# Patient Record
Sex: Male | Born: 1955 | Race: White | Hispanic: No
Health system: Southern US, Community
[De-identification: ages and names within clinical notes are randomized; demographics above are authoritative.]

## PROBLEM LIST (undated history)

## (undated) DIAGNOSIS — I10 Essential (primary) hypertension: Secondary | ICD-10-CM

## (undated) DIAGNOSIS — E119 Type 2 diabetes mellitus without complications: Secondary | ICD-10-CM

---

## 1998-11-02 ENCOUNTER — Emergency Department (HOSPITAL_COMMUNITY): Admission: EM | Admit: 1998-11-02 | Discharge: 1998-11-02 | Payer: Self-pay | Admitting: Emergency Medicine

## 1998-11-02 ENCOUNTER — Encounter: Payer: Self-pay | Admitting: Emergency Medicine

## 1998-11-22 ENCOUNTER — Emergency Department (HOSPITAL_COMMUNITY): Admission: EM | Admit: 1998-11-22 | Discharge: 1998-11-22 | Payer: Self-pay | Admitting: *Deleted

## 1999-02-02 ENCOUNTER — Encounter: Payer: Self-pay | Admitting: Emergency Medicine

## 1999-02-02 ENCOUNTER — Emergency Department (HOSPITAL_COMMUNITY): Admission: EM | Admit: 1999-02-02 | Discharge: 1999-02-02 | Payer: Self-pay | Admitting: Emergency Medicine

## 1999-02-17 ENCOUNTER — Emergency Department (HOSPITAL_COMMUNITY): Admission: EM | Admit: 1999-02-17 | Discharge: 1999-02-17 | Payer: Self-pay | Admitting: Emergency Medicine

## 1999-06-18 ENCOUNTER — Emergency Department (HOSPITAL_COMMUNITY): Admission: EM | Admit: 1999-06-18 | Discharge: 1999-06-18 | Payer: Self-pay | Admitting: Emergency Medicine

## 1999-06-18 ENCOUNTER — Encounter: Payer: Self-pay | Admitting: Emergency Medicine

## 2000-04-04 ENCOUNTER — Encounter: Payer: Self-pay | Admitting: Emergency Medicine

## 2000-04-04 ENCOUNTER — Emergency Department (HOSPITAL_COMMUNITY): Admission: EM | Admit: 2000-04-04 | Discharge: 2000-04-04 | Payer: Self-pay | Admitting: Emergency Medicine

## 2000-04-21 ENCOUNTER — Encounter: Admission: RE | Admit: 2000-04-21 | Discharge: 2000-04-21 | Payer: Self-pay | Admitting: Orthopaedic Surgery

## 2000-04-21 ENCOUNTER — Encounter: Payer: Self-pay | Admitting: Orthopaedic Surgery

## 2000-11-10 ENCOUNTER — Encounter: Admission: RE | Admit: 2000-11-10 | Discharge: 2001-02-08 | Payer: Self-pay | Admitting: Anesthesiology

## 2001-12-06 ENCOUNTER — Emergency Department (HOSPITAL_COMMUNITY): Admission: EM | Admit: 2001-12-06 | Discharge: 2001-12-07 | Payer: Self-pay | Admitting: *Deleted

## 2002-01-11 ENCOUNTER — Ambulatory Visit (HOSPITAL_BASED_OUTPATIENT_CLINIC_OR_DEPARTMENT_OTHER): Admission: RE | Admit: 2002-01-11 | Discharge: 2002-01-11 | Payer: Self-pay | Admitting: Orthopaedic Surgery

## 2004-08-15 ENCOUNTER — Emergency Department (HOSPITAL_COMMUNITY): Admission: EM | Admit: 2004-08-15 | Discharge: 2004-08-15 | Payer: Self-pay | Admitting: Emergency Medicine

## 2004-08-16 ENCOUNTER — Ambulatory Visit (HOSPITAL_COMMUNITY): Admission: RE | Admit: 2004-08-16 | Discharge: 2004-08-17 | Payer: Self-pay | Admitting: Orthopaedic Surgery

## 2005-07-16 ENCOUNTER — Ambulatory Visit (HOSPITAL_COMMUNITY): Admission: RE | Admit: 2005-07-16 | Discharge: 2005-07-18 | Payer: Self-pay | Admitting: Orthopaedic Surgery

## 2005-12-10 ENCOUNTER — Ambulatory Visit (HOSPITAL_COMMUNITY): Admission: RE | Admit: 2005-12-10 | Discharge: 2005-12-10 | Payer: Self-pay | Admitting: Orthopaedic Surgery

## 2006-01-14 ENCOUNTER — Ambulatory Visit (HOSPITAL_COMMUNITY): Admission: RE | Admit: 2006-01-14 | Discharge: 2006-01-15 | Payer: Medicare Other | Admitting: Orthopaedic Surgery

## 2006-01-14 ENCOUNTER — Ambulatory Visit: Payer: Self-pay | Admitting: Internal Medicine

## 2006-01-15 ENCOUNTER — Encounter: Payer: Self-pay | Admitting: Emergency Medicine

## 2013-08-20 ENCOUNTER — Other Ambulatory Visit: Payer: Self-pay | Admitting: Physician Assistant

## 2016-03-27 ENCOUNTER — Ambulatory Visit: Payer: Self-pay | Admitting: *Deleted

## 2017-05-01 ENCOUNTER — Other Ambulatory Visit: Payer: Self-pay | Admitting: Internal Medicine

## 2017-05-01 DIAGNOSIS — M5416 Radiculopathy, lumbar region: Secondary | ICD-10-CM

## 2020-07-14 ENCOUNTER — Emergency Department: Admit: 2020-07-14 | Payer: Medicare Other | Admitting: Internal Medicine

## 2020-07-14 ENCOUNTER — Emergency Department: Admission: EM | Disposition: E | Payer: Self-pay | Source: Home / Self Care | Attending: Emergency Medicine

## 2020-07-14 ENCOUNTER — Other Ambulatory Visit: Payer: Self-pay

## 2020-07-14 ENCOUNTER — Emergency Department: Payer: Medicare Other

## 2020-07-14 ENCOUNTER — Encounter: Payer: Self-pay | Admitting: Emergency Medicine

## 2020-07-14 ENCOUNTER — Emergency Department
Admission: EM | Admit: 2020-07-14 | Discharge: 2020-07-17 | Disposition: E | Payer: Medicare Other | Attending: Emergency Medicine | Admitting: Emergency Medicine

## 2020-07-14 DIAGNOSIS — I10 Essential (primary) hypertension: Secondary | ICD-10-CM | POA: Insufficient documentation

## 2020-07-14 DIAGNOSIS — Z20822 Contact with and (suspected) exposure to covid-19: Secondary | ICD-10-CM | POA: Insufficient documentation

## 2020-07-14 DIAGNOSIS — R4182 Altered mental status, unspecified: Secondary | ICD-10-CM | POA: Insufficient documentation

## 2020-07-14 DIAGNOSIS — E1310 Other specified diabetes mellitus with ketoacidosis without coma: Secondary | ICD-10-CM | POA: Insufficient documentation

## 2020-07-14 DIAGNOSIS — I249 Acute ischemic heart disease, unspecified: Secondary | ICD-10-CM

## 2020-07-14 DIAGNOSIS — E1311 Other specified diabetes mellitus with ketoacidosis with coma: Secondary | ICD-10-CM

## 2020-07-14 DIAGNOSIS — I469 Cardiac arrest, cause unspecified: Secondary | ICD-10-CM | POA: Diagnosis present

## 2020-07-14 HISTORY — DX: Type 2 diabetes mellitus without complications: E11.9

## 2020-07-14 HISTORY — DX: Essential (primary) hypertension: I10

## 2020-07-14 LAB — COMPREHENSIVE METABOLIC PANEL
ALT: 62 U/L — ABNORMAL HIGH (ref 0–44)
AST: 154 U/L — ABNORMAL HIGH (ref 15–41)
Albumin: 2.6 g/dL — ABNORMAL LOW (ref 3.5–5.0)
Alkaline Phosphatase: 77 U/L (ref 38–126)
BUN: 54 mg/dL — ABNORMAL HIGH (ref 8–23)
CO2: <7 mmol/L — ABNORMAL LOW (ref 22–32)
Calcium: 7.4 mg/dL — ABNORMAL LOW (ref 8.9–10.3)
Chloride: 100 mmol/L (ref 98–111)
Creatinine, Ser: 2.63 mg/dL — ABNORMAL HIGH (ref 0.61–1.24)
GFR, Estimated: 27 mL/min — ABNORMAL LOW (ref >60)
Glucose, Bld: 631 mg/dL (ref 70–99)
Potassium: 4.6 mmol/L (ref 3.5–5.1)
Sodium: 127 mmol/L — ABNORMAL LOW (ref 135–145)
Total Bilirubin: 1.7 mg/dL — ABNORMAL HIGH (ref 0.3–1.2)
Total Protein: 5.3 g/dL — ABNORMAL LOW (ref 6.5–8.1)

## 2020-07-14 LAB — CBC WITH DIFFERENTIAL/PLATELET
Abs Immature Granulocytes: 0.21 10*3/uL — ABNORMAL HIGH (ref 0.00–0.07)
Basophils Absolute: 0 10*3/uL (ref 0.0–0.1)
Basophils Relative: 0 %
Eosinophils Absolute: 0 10*3/uL (ref 0.0–0.5)
Eosinophils Relative: 0 %
HCT: 45.1 % (ref 39.0–52.0)
Hemoglobin: 15.3 g/dL (ref 13.0–17.0)
Immature Granulocytes: 2 %
Lymphocytes Relative: 16 %
Lymphs Abs: 1.9 10*3/uL (ref 0.7–4.0)
MCH: 30.4 pg (ref 26.0–34.0)
MCHC: 33.9 g/dL (ref 30.0–36.0)
MCV: 89.5 fL (ref 80.0–100.0)
Monocytes Absolute: 0.6 10*3/uL (ref 0.1–1.0)
Monocytes Relative: 5 %
Neutro Abs: 9.3 10*3/uL — ABNORMAL HIGH (ref 1.7–7.7)
Neutrophils Relative %: 77 %
Platelets: 163 10*3/uL (ref 150–400)
RBC: 5.04 MIL/uL (ref 4.22–5.81)
RDW: 13.3 % (ref 11.5–15.5)
WBC: 12 10*3/uL — ABNORMAL HIGH (ref 4.0–10.5)
nRBC: 0 % (ref 0.0–0.2)

## 2020-07-14 LAB — BLOOD GAS, ARTERIAL
FIO2: 1
MECHVT: 520 mL
Patient temperature: 37
RATE: 20 resp/min
pCO2 arterial: 39 mmHg (ref 32.0–48.0)
pH, Arterial: <6.9 — CL (ref 7.350–7.450)
pO2, Arterial: 454 mmHg — ABNORMAL HIGH (ref 83.0–108.0)

## 2020-07-14 LAB — BRAIN NATRIURETIC PEPTIDE: B Natriuretic Peptide: 1483.8 pg/mL — ABNORMAL HIGH (ref 0.0–100.0)

## 2020-07-14 LAB — LACTIC ACID, PLASMA
Lactic Acid, Venous: 2.2 mmol/L (ref 0.5–1.9)
Lactic Acid, Venous: 0.9 mmol/L (ref 0.5–1.9)

## 2020-07-14 LAB — URINALYSIS, COMPLETE (UACMP) WITH MICROSCOPIC
Bilirubin Urine: NEGATIVE
Glucose, UA: >=500 mg/dL — AB
Ketones, ur: 20 mg/dL — AB
Leukocytes,Ua: NEGATIVE
Nitrite: NEGATIVE
Protein, ur: >=300 mg/dL — AB
Specific Gravity, Urine: 1.023 (ref 1.005–1.030)
pH: 5 (ref 5.0–8.0)

## 2020-07-14 LAB — PROTIME-INR
INR: 1.4 — ABNORMAL HIGH (ref 0.8–1.2)
Prothrombin Time: 16.9 seconds — ABNORMAL HIGH (ref 11.4–15.2)

## 2020-07-14 LAB — RESPIRATORY PANEL BY RT PCR (FLU A&B, COVID)
Influenza A by PCR: NEGATIVE
Influenza B by PCR: NEGATIVE
SARS Coronavirus 2 by RT PCR: NEGATIVE

## 2020-07-14 LAB — BETA-HYDROXYBUTYRIC ACID

## 2020-07-14 LAB — TROPONIN I (HIGH SENSITIVITY): Troponin I (High Sensitivity): >27000 ng/L (ref <18)

## 2020-07-14 LAB — APTT: aPTT: 34 seconds (ref 24–36)

## 2020-07-14 SURGERY — CORONARY/GRAFT ACUTE MI REVASCULARIZATION
Anesthesia: Moderate Sedation

## 2020-07-14 MED ORDER — NOREPINEPHRINE 4 MG/250ML-% IV SOLN: 0.0000 ug/min | INTRAVENOUS | Status: DC

## 2020-07-14 MED ORDER — MIDAZOLAM HCL 2 MG/2ML IJ SOLN
2.0000 mg | INTRAMUSCULAR | Status: DC | PRN
  Administered 2020-07-14: 2 mg via INTRAVENOUS
  Filled 2020-07-14: qty 2

## 2020-07-14 MED ORDER — FENTANYL CITRATE (PF) 100 MCG/2ML IJ SOLN
50.0000 ug | INTRAMUSCULAR | Status: DC | PRN
  Administered 2020-07-14: 50 ug via INTRAVENOUS
  Filled 2020-07-14: qty 2

## 2020-07-14 MED ORDER — DEXTROSE 50 % IV SOLN: 0.0000 mL | INTRAVENOUS | Status: DC | PRN

## 2020-07-14 MED ORDER — MIDAZOLAM HCL 2 MG/2ML IJ SOLN: 2.0000 mg | INTRAMUSCULAR | Status: DC | PRN

## 2020-07-14 MED ORDER — ETOMIDATE 2 MG/ML IV SOLN
15.0000 mg | Freq: Once | INTRAVENOUS | Status: AC
  Administered 2020-07-14: 15 mg via INTRAVENOUS

## 2020-07-14 MED ORDER — HEPARIN (PORCINE) 25000 UT/250ML-% IV SOLN: 1150.0000 [IU]/h | INTRAVENOUS | Status: DC

## 2020-07-14 MED ORDER — SODIUM CHLORIDE 0.9 % IV BOLUS
1000.0000 mL | Freq: Once | INTRAVENOUS | Status: AC
  Administered 2020-07-14: 1000 mL via INTRAVENOUS

## 2020-07-14 MED ORDER — LACTATED RINGERS IV BOLUS: 20.0000 mL/kg | Freq: Once | INTRAVENOUS | Status: DC

## 2020-07-14 MED ORDER — NOREPINEPHRINE 4 MG/250ML-% IV SOLN
0.0000 ug/min | INTRAVENOUS | Status: DC
  Administered 2020-07-14: 5 ug/min via INTRAVENOUS
  Filled 2020-07-14: qty 250

## 2020-07-14 MED ORDER — INSULIN REGULAR(HUMAN) IN NACL 100-0.9 UT/100ML-% IV SOLN
INTRAVENOUS | Status: DC
  Filled 2020-07-14: qty 100

## 2020-07-14 MED ORDER — POTASSIUM CHLORIDE 10 MEQ/100ML IV SOLN
10.0000 meq | INTRAVENOUS | Status: AC
  Filled 2020-07-14: qty 100

## 2020-07-14 MED ORDER — STERILE WATER FOR INJECTION IV SOLN
INTRAVENOUS | Status: DC
  Filled 2020-07-14: qty 850
  Filled 2020-07-14: qty 150
  Filled 2020-07-14: qty 850

## 2020-07-14 MED ORDER — DEXTROSE IN LACTATED RINGERS 5 % IV SOLN: INTRAVENOUS | Status: DC

## 2020-07-14 MED ORDER — ASPIRIN 300 MG RE SUPP: 300.0000 mg | RECTAL | Status: DC

## 2020-07-14 MED ORDER — HEPARIN BOLUS VIA INFUSION
4000.0000 [IU] | Freq: Once | INTRAVENOUS | Status: DC
  Filled 2020-07-14: qty 4000

## 2020-07-14 MED ORDER — LACTATED RINGERS IV SOLN: INTRAVENOUS | Status: DC

## 2020-07-14 MED ORDER — SODIUM BICARBONATE 8.4 % IV SOLN
100.0000 meq | Freq: Once | INTRAVENOUS | Status: AC
  Administered 2020-07-14: 100 meq via INTRAVENOUS
  Filled 2020-07-14: qty 50

## 2020-07-14 MED ORDER — ROCURONIUM BROMIDE 50 MG/5ML IV SOLN
100.0000 mg | Freq: Once | INTRAVENOUS | Status: AC
  Administered 2020-07-14: 100 mg via INTRAVENOUS

## 2020-07-14 MED ORDER — MORPHINE SULFATE (PF) 2 MG/ML IV SOLN: 1.0000 mg | INTRAVENOUS | Status: DC | PRN

## 2020-07-14 MED ORDER — FENTANYL CITRATE (PF) 100 MCG/2ML IJ SOLN: 50.0000 ug | INTRAMUSCULAR | Status: DC | PRN

## 2020-07-15 MED FILL — Medication: Qty: 1 | Status: AC

## 2020-07-17 NOTE — Progress Notes (Signed)
Patient Et tube withdrawn 3cm to 23 at the lip per MD request. Then Rt assisted with patient transport to CT and back with no complications.

## 2020-07-17 NOTE — ED Notes (Addendum)
Patient extubated at 1030. Family at bedside.

## 2020-07-17 NOTE — Consult Note (Signed)
CARDIOLOGY CONSULT NOTE               Patient ID: Steve Meyer MRN: [id number] DOB/AGE: June 14, 1956 64 y.o.  Admit date: 07-24-20 Referring Physician Dr. Larinda Buttery ER physician Primary Physician unknown Primary Cardiologist unknown Reason for Consultation cardiac arrest with sudden death possible STEMI  HPI: Patient presented as a possible code STEMI after sudden death episode at home with his girlfriend called rescue the patient is reported not been feeling well for several days but refused EMS transport to the emergency room she states that he had expressed wishes not to be assisted artificially with sustaining life measures.  There is no evidence the patient had chest pain but states she was not feeling well and then had a sudden death episode at home EMS presented on the scene is unclear how long he was down it was at least 15 minutes with little or no CPR intervention.  Patient was intubated possible sedation brought to the emergency room and was hypotensive requiring pressors initial troponins were greater than 27,000 and EKG was borderline for possible STEMI neurologically the patient was not very responsive CT of the head initially was negative without contrast.  The case was discussed in detail with the emergency room physician who is in contact with his closest live-in partner.  She has expressed wishes not to proceed with heroic measures that was not his wishes with prefer if we proceed with comfort measures only and immediate extubation.  I have advised ER physician to let me know if any further assistance would be necessary from a cardiac standpoint but the above discussion and plan is not unreasonable in my opinion.  Review of systems complete and found to be negative unless listed above     No past medical history on file.    (Not in a hospital admission)  Social History   Socioeconomic History  . Marital status: Divorced    Spouse name: Not on file  . Number of  children: Not on file  . Years of education: Not on file  . Highest education level: Not on file  Occupational History  . Not on file  Tobacco Use  . Smoking status: Not on file  Substance and Sexual Activity  . Alcohol use: Not on file  . Drug use: Not on file  . Sexual activity: Not on file  Other Topics Concern  . Not on file  Social History Narrative  . Not on file   Social Determinants of Health   Financial Resource Strain:   . Difficulty of Paying Living Expenses: Not on file  Food Insecurity:   . Worried About Programme researcher, broadcasting/film/video in the Last Year: Not on file  . Ran Out of Food in the Last Year: Not on file  Transportation Needs:   . Lack of Transportation (Medical): Not on file  . Lack of Transportation (Non-Medical): Not on file  Physical Activity:   . Days of Exercise per Week: Not on file  . Minutes of Exercise per Session: Not on file  Stress:   . Feeling of Stress : Not on file  Social Connections:   . Frequency of Communication with Friends and Family: Not on file  . Frequency of Social Gatherings with Friends and Family: Not on file  . Attends Religious Services: Not on file  . Active Member of Clubs or Organizations: Not on file  . Attends Banker Meetings: Not on file  . Marital Status: Not on  file  Intimate Partner Violence:   . Fear of Current or Ex-Partner: Not on file  . Emotionally Abused: Not on file  . Physically Abused: Not on file  . Sexually Abused: Not on file    No family history on file.    Review of systems complete and found to be negative unless listed above      PHYSICAL EXAM  Patient intubated sedated unresponsive General: Well developed, well nourished, in no acute distress HEENT:  Normocephalic and atramatic Neck:  No JVD.  Lungs: Clear bilaterally to auscultation and percussion. Heart: HRRR . Normal S1 and S2 without gallops or murmurs.  Abdomen: Bowel sounds are positive, abdomen soft and non-tender  Msk:   Back normal, normal gait. Normal strength and tone for age. Extremities: No clubbing, cyanosis or edema.   Neuro: Unresponsive Psych: Unable to assess  Labs:   Lab Results  Component Value Date   WBC 12.0 (H) 08-05-2020   HGB 15.3 2020/08/05   HCT 45.1 08-05-20   MCV 89.5 Aug 05, 2020   PLT 163 08-05-20    Recent Labs  Lab 2020-08-05 0818  NA 127*  K 4.6  CL 100  CO2 <7*  BUN 54*  CREATININE 2.63*  CALCIUM 7.4*  PROT 5.3*  BILITOT 1.7*  ALKPHOS 77  ALT 62*  AST 154*  GLUCOSE 631*   No results found for: CKTOTAL, CKMB, CKMBINDEX, TROPONINI No results found for: CHOL No results found for: HDL No results found for: LDLCALC No results found for: TRIG No results found for: CHOLHDL No results found for: LDLDIRECT    Radiology: DG Chest Portable 1 View  Result Date: Aug 05, 2020 CLINICAL DATA:  Cardiac arrest. EXAM: PORTABLE CHEST 1 VIEW COMPARISON:  12/04/2005. FINDINGS: Endotracheal tube tip noted just over the orifice of the right mainstem bronchus. Proximal repositioning 3 cm suggested. Heart size stable. Low lung volumes with bilateral atelectatic changes. No pleural effusion or pneumothorax. Degenerative changes scoliosis thoracic spine. IMPRESSION: 1. Endotracheal tube tip noted just over the orifice of the right mainstem bronchus. Proximal repositioning 3 cm suggested. 2. NG tube tip noted in the upper most portion stomach. Advancement of approximately 15 cm should be considered. 3.  Low lung volumes with bilateral atelectatic changes. Critical Value/emergent results were called by telephone at the time of interpretation on 2020/08/05 at 9:09 am to provider [facility] , who verbally acknowledged these results. Electronically Signed   By: Maisie Fus  Register   On: 2020-08-05 09:10   DG Abd Portable 1 View  Result Date: 08/05/2020 CLINICAL DATA:  Orogastric tube placement EXAM: PORTABLE ABDOMEN - 1 VIEW COMPARISON:  None. FINDINGS: Distal tip of the enteric tube  terminates just beyond the level of the GE junction. The visualized bowel gas pattern is nonobstructive. Degenerative changes within the lumbar spine. IMPRESSION: Distal tip of the enteric tube terminates just beyond the level of the GE junction. Recommend advancement 7-10 cm. Electronically Signed   By: Duanne Guess D.O.   On: 08/05/20 09:03    EKG: Normal sinus rhythm diffuse nonspecific T2 changes possible subtle ST elevation anteriorly EF around 70%  ASSESSMENT AND PLAN:  Status post arrest Possible STEMI Elevated troponin Abnormal EKG Unresponsive altered mental status DKA Acidosis Renal insufficiency Profound hypotension . Plan Unclear whether this is primarily cardiac or not EKG is not diagnostic of a STEMI but there is significant EKG changes Recommend heparin for now for anticoagulation Echocardiogram will be helpful for further assessment evaluation Correct acidosis and DKA Recommend neurology  follow-up and evaluation with consultation Pressors for hypotension Would recommend defer emergent cardiac cath until many of these issues have been resolved Closest person to him with is requesting comfort measures no further invasive management and potential terminal extubation Dr. Larinda Buttery ER physician is resolving those issues I recommend a conservative approach at this point given his multitude of medical problems is not clear whether his cardiac issues are primary or secondary to all these other issues Signed: Alwyn Pea MD Jul 31, 2020, 9:33 AM

## 2020-07-17 NOTE — ED Notes (Signed)
King airway removed and pt intubated by Larinda Buttery MD at this time. Tube placement confirmed by color change and auscultation.  Tube size 8, 26 at the R lip.

## 2020-07-17 NOTE — ED Notes (Signed)
Wet guaze placed to bilateral eyes.

## 2020-07-17 NOTE — Progress Notes (Signed)
Patient extubated to comfort care per family and MD request.   

## 2020-07-17 NOTE — ED Notes (Signed)
Called STEMI to [facility] [phone]

## 2020-07-17 NOTE — ED Notes (Signed)
Patient armbands remain on patient, patient placed in body bag, secured bag with tag labeled with patient identifier and extra labels placed on bag.

## 2020-07-17 NOTE — ED Notes (Signed)
OG tube advanced to 60 at the R lip.

## 2020-07-17 NOTE — ED Notes (Addendum)
Time of death 23. Death pronounced by Dr Larinda Buttery at this time.

## 2020-07-17 NOTE — ED Notes (Signed)
Cardiology at bedside.

## 2020-07-17 NOTE — ED Provider Notes (Signed)
[facility] Emergency Department Provider Note   ____________________________________________   First MD Initiated Contact with Patient 07-17-2020 [phone]     (approximate)  I have reviewed the triage vital signs and the nursing notes.   HISTORY  Chief Complaint CPR    HPI Steve Meyer is a 64 y.o. male with past medical history of hypertension and diabetes who presents to the ED for cardiac arrest.  Per EMS, patient had been not feeling well for the past couple of days, unable to eat or drink during this time.  His partner had been talking to them this morning when he again said he did not feel well, when she returned to the room he was unresponsive.  Upon EMS arrival, patient noted to be in cardiac arrest with PEA.  He received approximately 10 minutes of CPR with 2 rounds of epinephrine, after which they achieved ROSC.  King airway in place upon arrival.        Past Medical History:  Diagnosis Date  . Diabetes ([place])   . Hypertension     There are no problems to display for this patient.   History reviewed. No pertinent surgical history.  Prior to Admission medications   Not on File    Allergies Patient has no allergy information on record.  No family history on file.  Social History Social History   Tobacco Use  . Smoking status: Not on file  Substance Use Topics  . Alcohol use: Not on file  . Drug use: Not on file    Review of Systems Unable to obtain secondary to cardiac arrest  ____________________________________________   PHYSICAL EXAM:  VITAL SIGNS: ED Triage Vitals  Enc Vitals Group     BP 07/17/20 0811 (!) 141/113     Pulse --      Resp 2020-07-17 0815 20     Temp --      Temp src --      SpO2 --      Weight 2020-07-17 0820 190 lb (86.2 kg)     Height Jul 17, 2020 0820 6' (1.829 m)     Head Circumference --      Peak Flow --      Pain Score --      Pain Loc --      Pain Edu? --      Excl. in [place]? --      Constitutional: Unresponsive with no spontaneous movements. Eyes: Conjunctivae are normal.  Pupils with very sluggish response to light. Head: Atraumatic. Nose: No congestion/rhinnorhea. Mouth/Throat: Mucous membranes are moist. Neck: Normal ROM Cardiovascular: Tachycardic, irregularly irregular rhythm. Grossly normal heart sounds. Respiratory: ET tube in place, occasional spontaneous breaths. Gastrointestinal: Soft and nondistended. Genitourinary: deferred Musculoskeletal: No lower extremity edema, extremities cool to touch. Neurologic: Unresponsive, no purposeful movements or response to painful stimuli. Skin:  Skin is cool to touch.  No rash noted. Psychiatric: Unable to assess.  ____________________________________________   LABS (all labs ordered are listed, but only abnormal results are displayed)  Labs Reviewed  CBC WITH DIFFERENTIAL/PLATELET - Abnormal; Notable for the following components:      Result Value   WBC 12.0 (*)    Neutro Abs 9.3 (*)    Abs Immature Granulocytes 0.21 (*)    All other components within normal limits  COMPREHENSIVE METABOLIC PANEL - Abnormal; Notable for the following components:   Sodium 127 (*)    CO2 <7 (*)    Glucose, Bld 631 (*)  BUN 54 (*)    Creatinine, Ser 2.63 (*)    Calcium 7.4 (*)    Total Protein 5.3 (*)    Albumin 2.6 (*)    AST 154 (*)    ALT 62 (*)    Total Bilirubin 1.7 (*)    GFR, Estimated 27 (*)    All other components within normal limits  BRAIN NATRIURETIC PEPTIDE - Abnormal; Notable for the following components:   B Natriuretic Peptide 1,483.8 (*)    All other components within normal limits  BLOOD GAS, ARTERIAL - Abnormal; Notable for the following components:   pH, Arterial <6.900 (*)    pO2, Arterial 454 (*)    All other components within normal limits  URINALYSIS, COMPLETE (UACMP) WITH MICROSCOPIC - Abnormal; Notable for the following components:   Color, Urine YELLOW (*)    APPearance HAZY (*)     Glucose, UA >=500 (*)    Hgb urine dipstick LARGE (*)    Ketones, ur 20 (*)    Protein, ur >=300 (*)    Bacteria, UA RARE (*)    All other components within normal limits  LACTIC ACID, PLASMA - Abnormal; Notable for the following components:   Lactic Acid, Venous 2.2 (*)    All other components within normal limits  PROTIME-INR - Abnormal; Notable for the following components:   Prothrombin Time 16.9 (*)    INR 1.4 (*)    All other components within normal limits  TROPONIN I (HIGH SENSITIVITY) - Abnormal; Notable for the following components:   Troponin I (High Sensitivity) >27,000 (*)    All other components within normal limits  RESPIRATORY PANEL BY RT PCR (FLU A&B, COVID)  LACTIC ACID, PLASMA  APTT  BETA-HYDROXYBUTYRIC ACID  BASIC METABOLIC PANEL  BASIC METABOLIC PANEL  BETA-HYDROXYBUTYRIC ACID  BASIC METABOLIC PANEL  CBG MONITORING, ED  CBG MONITORING, ED  TROPONIN I (HIGH SENSITIVITY)   ____________________________________________  EKG  ED ECG REPORT I, Blake Divine, the attending physician, personally viewed and interpreted this ECG.   Date: 08-04-2020  EKG Time: 8:19  Rate: 101  Rhythm: normal sinus rhythm  Axis: Normal  Intervals:nonspecific intraventricular conduction delay  ST&T Change: ST elevation anterolaterally with ST depression inferiorly  ED ECG REPORT I, Blake Divine, the attending physician, personally viewed and interpreted this ECG.   Date: 2020/08/04  EKG Time: 8:59  Rate: 76  Rhythm: normal sinus rhythm  Axis: Normal  Intervals:nonspecific intraventricular conduction delay  ST&T Change: ST elevation anterolaterally with reciprocal changes inferiorly, similar to previous   PROCEDURES  Procedure(s) performed (including Critical Care):  .Critical Care Performed by: Blake Divine, MD Authorized by: Blake Divine, MD   Critical care provider statement:    Critical care time (minutes):  70   Critical care time was exclusive of:   Separately billable procedures and treating other patients and teaching time   Critical care was necessary to treat or prevent imminent or life-threatening deterioration of the following conditions:  Cardiac failure and circulatory failure   Critical care was time spent personally by me on the following activities:  Discussions with consultants, evaluation of patient's response to treatment, examination of patient, ordering and performing treatments and interventions, ordering and review of laboratory studies, ordering and review of radiographic studies, pulse oximetry, re-evaluation of patient's condition, obtaining history from patient or surrogate and review of old charts   I assumed direction of critical care for this patient from another provider in my specialty: no    Procedure  Name: Intubation Date/Time: 07-18-2020 9:58 AM Performed by: Blake Divine, MD Pre-anesthesia Checklist: Patient identified, Patient being monitored, Emergency Drugs available, Timeout performed and Suction available Oxygen Delivery Method: Non-rebreather mask Preoxygenation: Pre-oxygenation with 100% oxygen Induction Type: Rapid sequence Ventilation: Mask ventilation without difficulty Laryngoscope Size: Mac and 4 Grade View: Grade I Tube size: 8.0 mm Number of attempts: 1 Placement Confirmation: ETT inserted through vocal cords under direct vision,  CO2 detector and Breath sounds checked- equal and bilateral Secured at: 26 cm Tube secured with: ETT holder Dental Injury: Teeth and Oropharynx as per pre-operative assessment         ____________________________________________   INITIAL IMPRESSION / ASSESSMENT AND PLAN / ED COURSE       64 year old male with past medical history of hypertension and diabetes presents to the ED following cardiac arrest.  Patient with a downtime of somewhere near 20 to 30 minutes, noted to be in PEA by EMS and ROSC achieved following 2 rounds of epinephrine.  On  arrival, patient with a regular rhythm and frequent PVCs, blood pressure is low and we will start on Levophed.  Initial EKG shows ST elevation anterior laterally with reciprocal changes, code STEMI called.  Case discussed with Dr. Clayborn Bigness from cardiology, and initial plan is for patient to go to the Cath Lab if head CT is normal.  CT head reviewed by me and shows no evidence of acute hemorrhage, however at this time his partner has arrived to the ED and is requesting he be transition to comfort care.  Lab work shows DKA and it is less clear that this was a primary cardiac event given partner reports he has been feeling bad with not eating or drinking over the past 2 to 3 days.  Case again discussed with Dr. Clayborn Bigness of cardiology, and decision made to hold off on Cath Lab at this time.  Given patient's partner is not married to him and does not have medical power of attorney, we will need to clarify with ethics committee whether she is able to make his medical decisions.  Meaningful recovery does seem unlikely at this point as patient remains unresponsive without sedation and transition to comfort care does seem reasonable.  Until we are able to better clarify who can make medical decisions, we will continue to provide full scope of care including bicarb and insulin for DKA.  ET tube was also retracted 3 cm given chest x-ray shows it to be in the right mainstem bronchus.  In further discussion with Amy Tippett of the ethics committee, it would be appropriate for patient's partner to make medical decisions as he has no other can and she has been living with him for greater than 10 years.  She continues to state that he would not want further aggressive care in this situation and she would like patient transitioned to comfort care only.  Prognosis is very poor at this point in time given patient remains unresponsive off all sedation with sluggish pupils.  Blood work shows severe acidosis and patient is unlikely  to have any meaningful recovery from this.  Transition to comfort care seems reasonable and at this point time all medications were stopped and patient was extubated.  He expired shortly afterwards and we will reach out to PCP for completion of death certificate.      ____________________________________________   FINAL CLINICAL IMPRESSION(S) / ED DIAGNOSES  Final diagnoses:  Cardiac arrest ([place])  Diabetic ketoacidosis with coma associated with other specified diabetes mellitus (  [place])  ACS (acute coronary syndrome) [facility])     ED Discharge Orders    None       Note:  This document was prepared using Dragon voice recognition software and may include unintentional dictation errors.   Blake Divine, MD 2020/07/26 1054
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
## 2020-07-17 NOTE — ED Notes (Signed)
Patient transported to morgue.

## 2020-07-17 NOTE — ED Triage Notes (Signed)
Pt here via [facility] intubated with king airway in place. Pt received x2 epi. Pt PEA on EMS arrival, pt here in sinus tach.  Per wife pt had "not been feeling well" x2 days, decreased PO intake.   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

## 2020-07-17 DEATH — deceased

## 2022-08-15 IMAGING — CT CT HEAD W/O CM
3 series · 16 of 47 positions shown, 19 images · non-contrast
Comparison: 08/15/2004 head CT.

CLINICAL DATA: Mental status change for 2 days. Intubated. No
reported injury.

EXAM:
CT HEAD WITHOUT CONTRAST
TECHNIQUE: Contiguous axial images were obtained from the base of the skull
through the vertex without intravenous contrast.

[Series 3: head wo · axial · 0.44mm/px · z∈[-39,+96]mm · 10 of 33 slices shown, 13 images]
[im 3/33  brain]
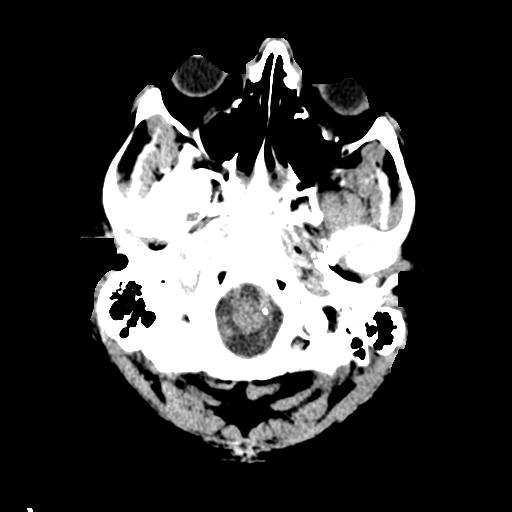
[im 3/33  bone]
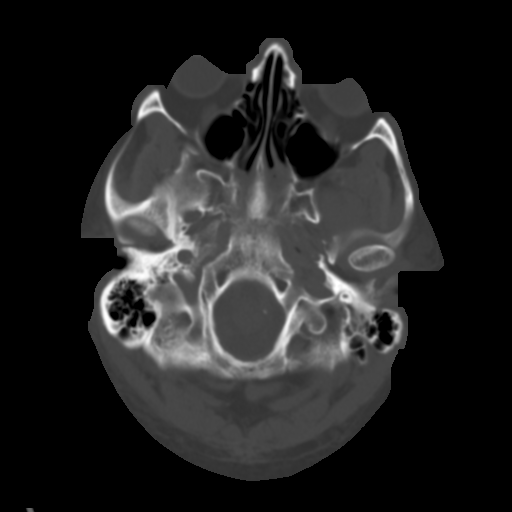
[im 6/33  brain]
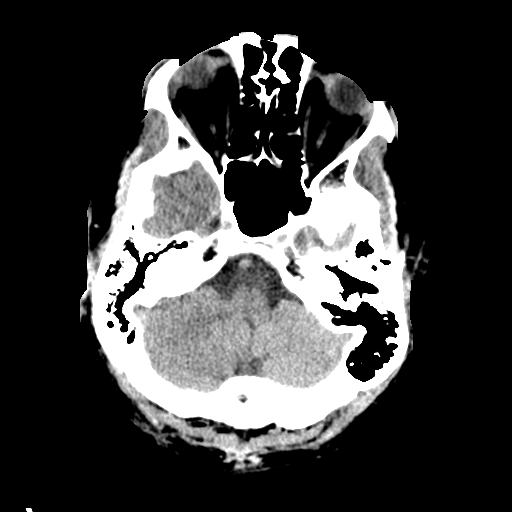
[im 9/33  brain]
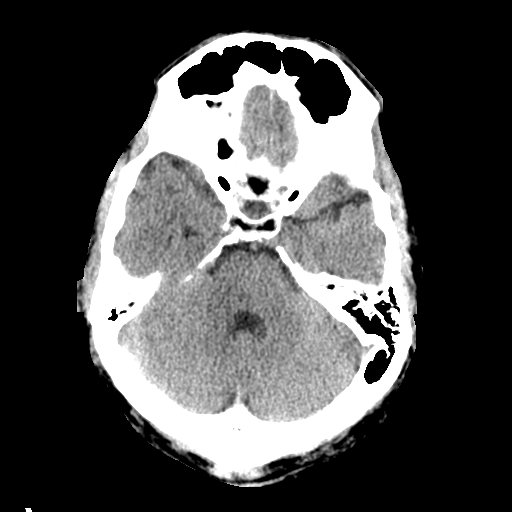
[im 12/33  brain]
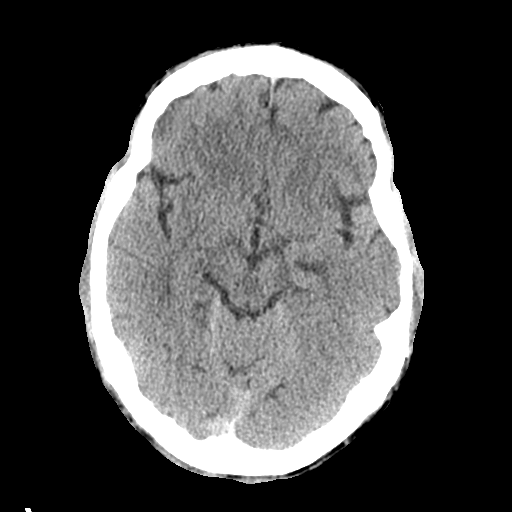
[im 15/33  brain]
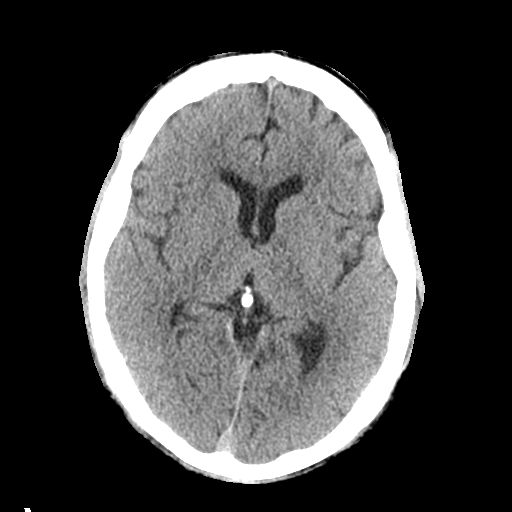
[im 15/33  bone]
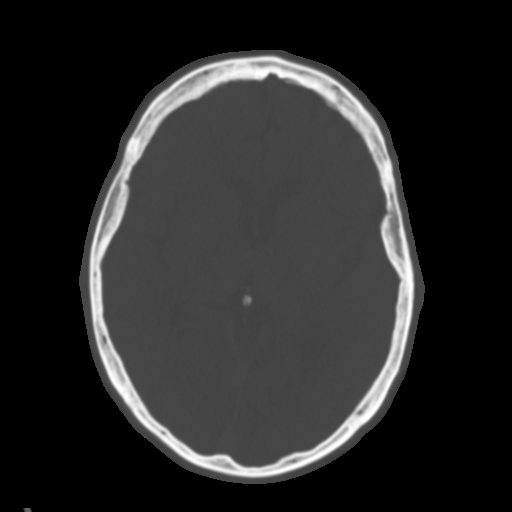
[im 18/33  brain]
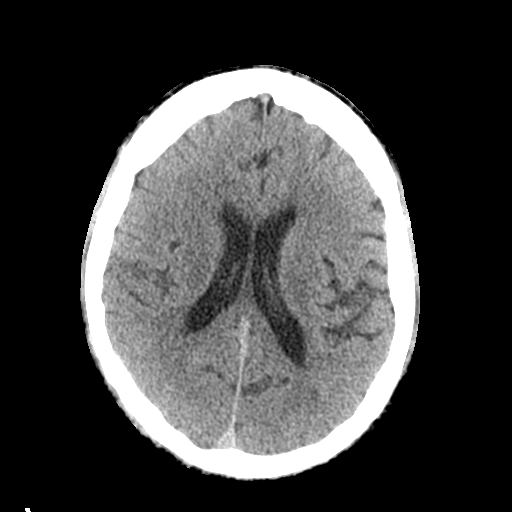
[im 21/33  brain]
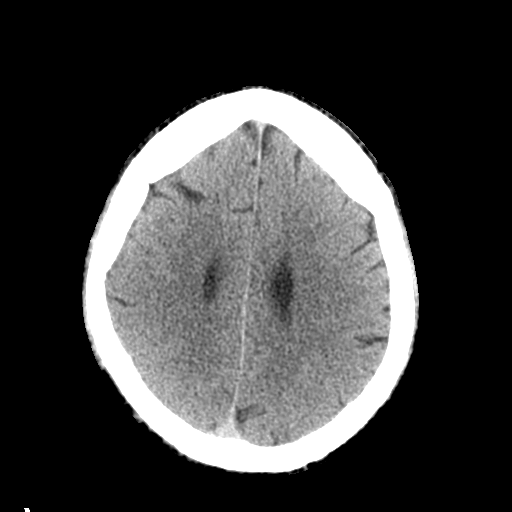
[im 25/33  brain]
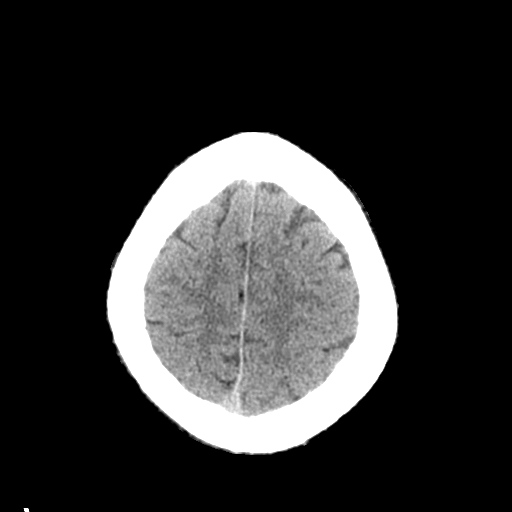
[im 27/33  brain]
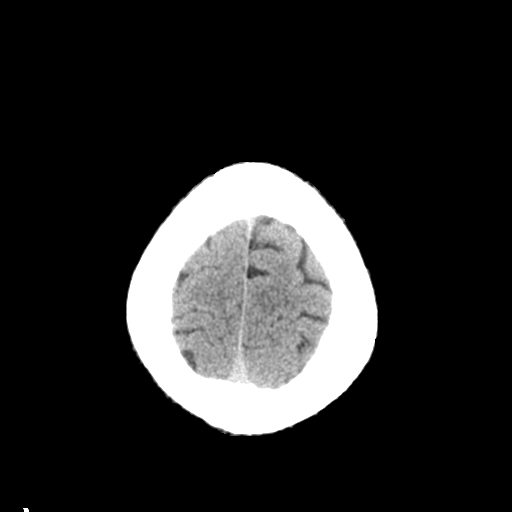
[im 27/33  bone]
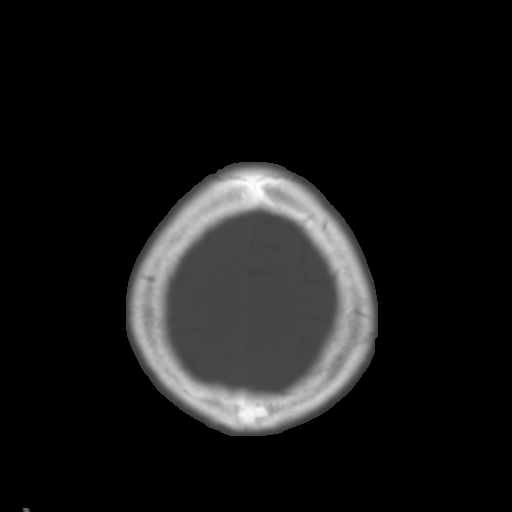
[im 30/33  brain]
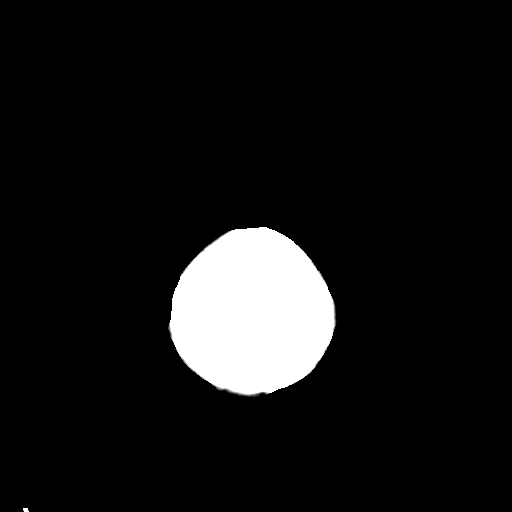

[Series 4: coronal soft tissue · coronal · 0.35mm/px · 3 of 69 slices shown]
[im 23/69  brain]
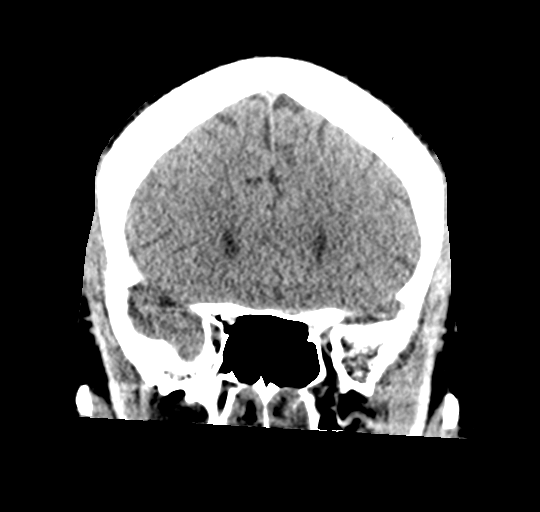
[im 31/69  brain]
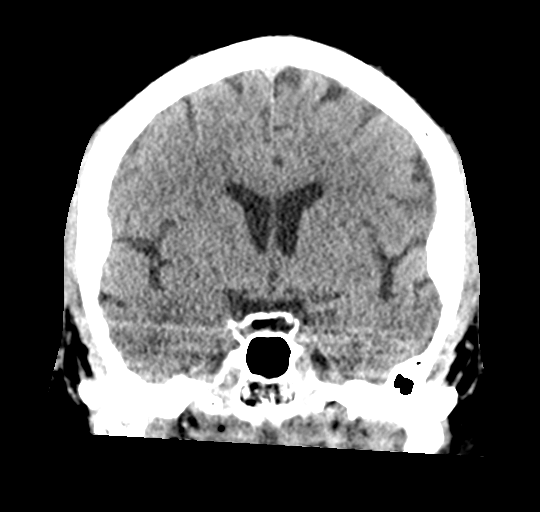
[im 38/69  brain]
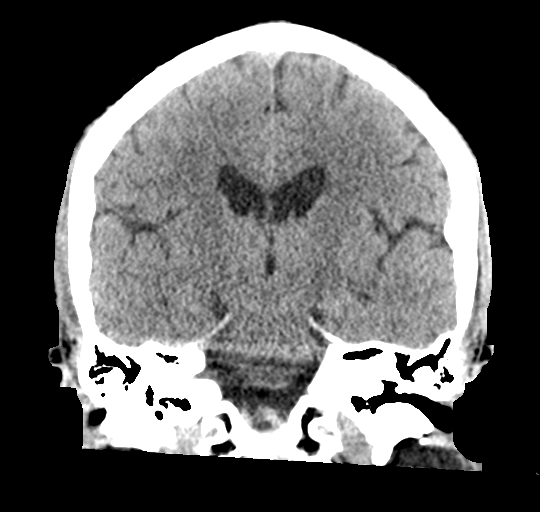

[Series 5: sagittal soft tissue · sagittal · 0.36mm/px · 3 of 58 slices shown]
[im 20/58  brain]
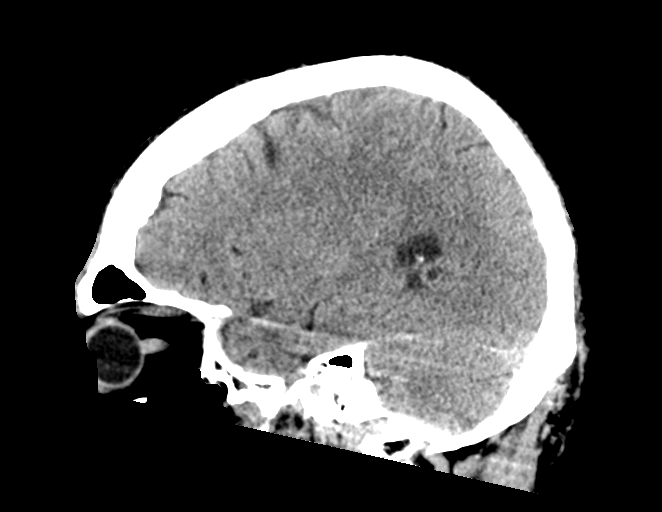
[im 29/58  brain]
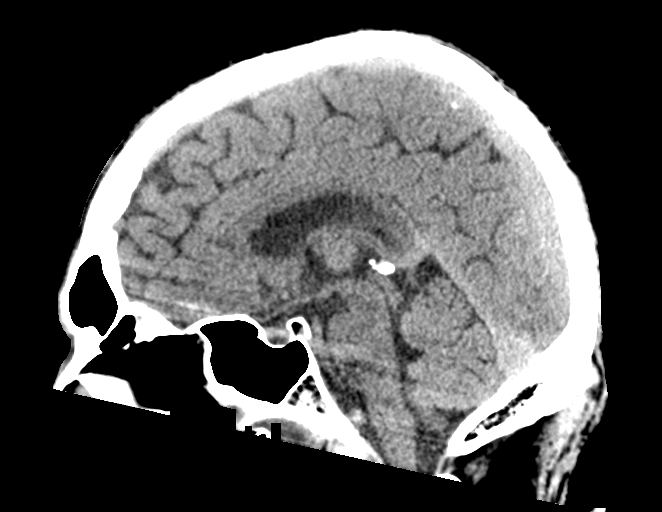
[im 39/58  brain]
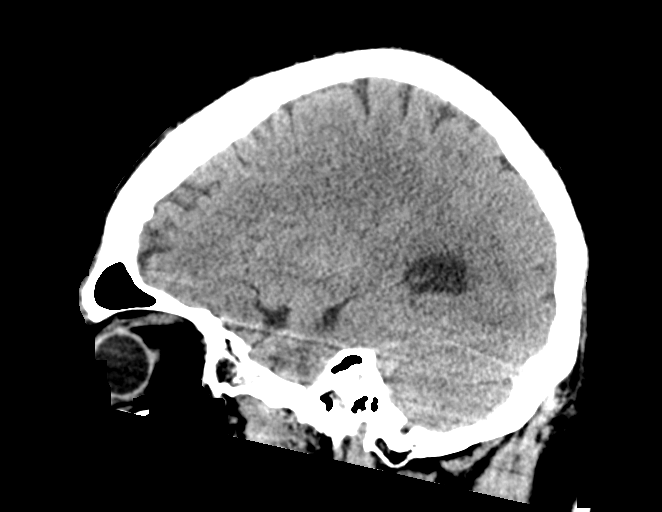

[16 of 47 positions shown; findings below may reference images not displayed]

FINDINGS: Brain: No evidence of parenchymal hemorrhage or extra-axial fluid
collection. No mass lesion, mass effect, or midline shift. No CT
evidence of acute infarction. Cerebral volume is age appropriate. No
ventriculomegaly.

Vascular: No acute abnormality.

Skull: No evidence of calvarial fracture.

Sinuses/Orbits: The visualized paranasal sinuses are essentially
clear.

Other:  The mastoid air cells are unopacified.
IMPRESSION: Negative head CT. No evidence of acute intracranial abnormality.

## 2022-08-15 IMAGING — DX DG ABD PORTABLE 1V
1 series · 1 of 1 positions shown · non-contrast
Comparison: None.

CLINICAL DATA: Orogastric tube placement

EXAM:
PORTABLE ABDOMEN - 1 VIEW

[abdomen supine]
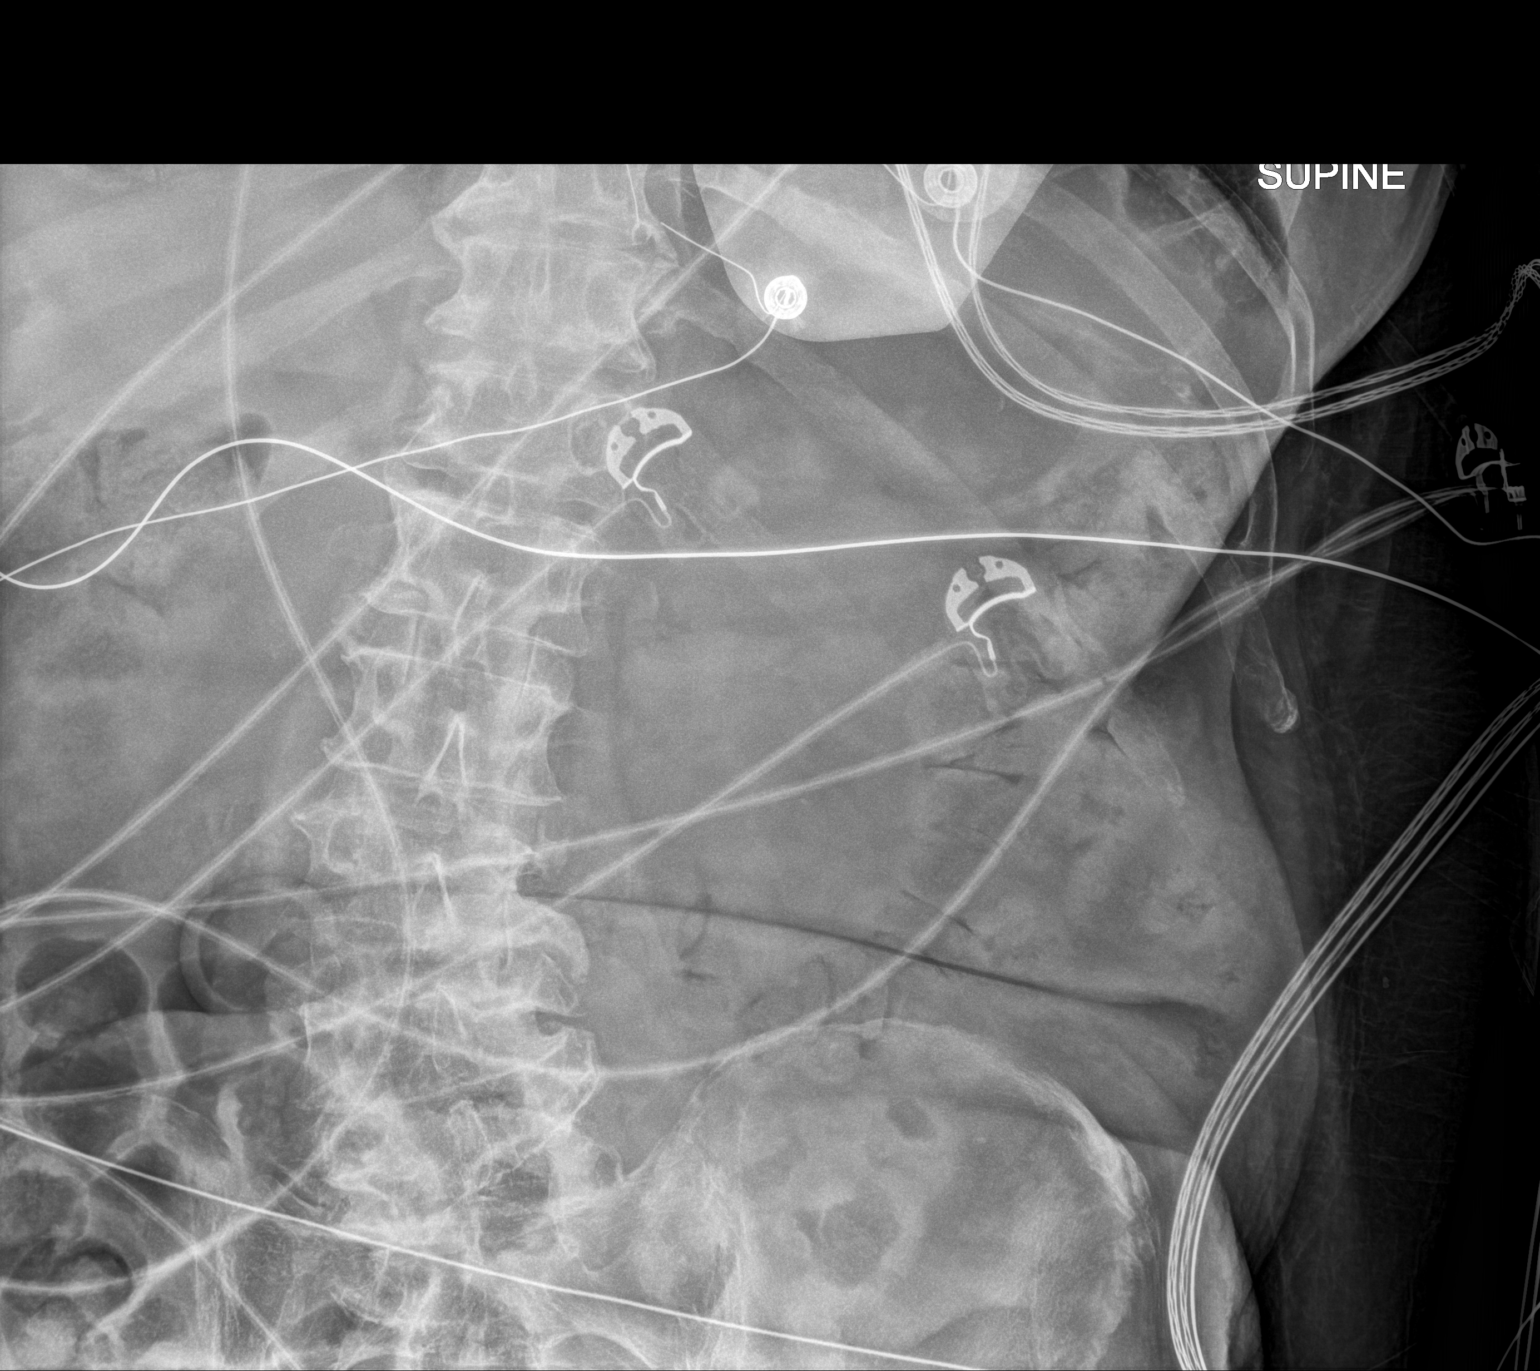

[1 of 1 positions shown; findings below may reference images not displayed]

FINDINGS: Distal tip of the enteric tube terminates just beyond the level of
the GE junction. The visualized bowel gas pattern is nonobstructive.
Degenerative changes within the lumbar spine.
IMPRESSION: Distal tip of the enteric tube terminates just beyond the level of
the GE junction. Recommend advancement 7-10 cm.
